# Patient Record
Sex: Female | Born: 1955 | Race: White | Hispanic: No | Marital: Married | Smoking: Former smoker
Health system: Southern US, Community
[De-identification: ages and names within clinical notes are randomized; demographics above are authoritative.]

## PROBLEM LIST (undated history)

## (undated) DIAGNOSIS — J3089 Other allergic rhinitis: Secondary | ICD-10-CM

## (undated) DIAGNOSIS — K219 Gastro-esophageal reflux disease without esophagitis: Secondary | ICD-10-CM

## (undated) DIAGNOSIS — J45909 Unspecified asthma, uncomplicated: Secondary | ICD-10-CM

## (undated) DIAGNOSIS — E039 Hypothyroidism, unspecified: Secondary | ICD-10-CM

## (undated) DIAGNOSIS — Z9889 Other specified postprocedural states: Secondary | ICD-10-CM

## (undated) DIAGNOSIS — I10 Essential (primary) hypertension: Secondary | ICD-10-CM

## (undated) DIAGNOSIS — R112 Nausea with vomiting, unspecified: Secondary | ICD-10-CM

## (undated) HISTORY — PX: OTHER SURGICAL HISTORY: SHX169

## (undated) HISTORY — PX: LYMPH NODE BIOPSY: SHX201

## (undated) HISTORY — PX: CHOLECYSTECTOMY: SHX55

## (undated) HISTORY — PX: KNEE ARTHROSCOPY: SUR90

## (undated) HISTORY — PX: ABDOMINAL HYSTERECTOMY: SHX81

---

## 2017-08-24 DIAGNOSIS — C9110 Chronic lymphocytic leukemia of B-cell type not having achieved remission: Secondary | ICD-10-CM

## 2017-08-24 HISTORY — DX: Chronic lymphocytic leukemia of B-cell type not having achieved remission: C91.10

## 2022-02-02 ENCOUNTER — Ambulatory Visit: Payer: Self-pay

## 2022-02-02 ENCOUNTER — Telehealth: Payer: Self-pay | Admitting: Orthopedic Surgery

## 2022-02-02 ENCOUNTER — Ambulatory Visit (INDEPENDENT_AMBULATORY_CARE_PROVIDER_SITE_OTHER): Payer: Managed Care, Other (non HMO) | Admitting: Orthopedic Surgery

## 2022-02-02 VITALS — BP 133/79 | HR 96 | Ht 67.0 in | Wt 200.0 lb

## 2022-02-02 DIAGNOSIS — R2232 Localized swelling, mass and lump, left upper limb: Secondary | ICD-10-CM | POA: Diagnosis not present

## 2022-02-02 DIAGNOSIS — M79642 Pain in left hand: Secondary | ICD-10-CM | POA: Diagnosis not present

## 2022-02-02 MED ORDER — TRAMADOL HCL 50 MG PO TABS: 50.0000 mg | ORAL_TABLET | Freq: Four times a day (QID) | ORAL | 0 refills | Status: AC | PRN

## 2022-02-02 NOTE — Telephone Encounter (Signed)
Patient called advised she is not having the MRI until 02/08/2022. Patient asked if Dr Tempie Donning can call in something that is not an NSAID for her?  Patient uses [facility]. The number to contact patient is [phone]

## 2022-02-02 NOTE — Progress Notes (Signed)
Office Visit Note   Patient: Autumn Hines           Date of Birth: [birth year]           MRN: [id number] Visit Date: 02/02/2022              Requested by: Sherilyn Cooter, MD [address] [place],  [place] [address] PCP: No primary care provider on file.   Assessment & Plan: Visit Diagnoses:  1. Pain of left hand   2. 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  Like to get an MRI to further evaluate this lesion.  We discussed conservative and surgical treatment options for both ganglion cyst and de Quervain's tenosynovitis.  She will see me back once the mass completed we will discuss next steps in treatment.  Follow-Up Instructions: No follow-ups on file.   Orders:  Orders Placed This Encounter  Procedures   XR Hand Complete Left   MR Wrist Left w/ contrast   No orders of the defined types were placed in this encounter.     Procedures: No procedures performed   Clinical Data: No additional findings.   Subjective: Chief Complaint  Patient presents with   Left Wrist - Pain    RIGHT Handed, Pain:5/10 with spikes to 8-9, +swelling,    This is a 66 year old right-hand-dominant female who presents with pain at the radial aspect of the left wrist.  This started approximately 3 weeks ago.  She started with having pain at the radial aspect of the wrist around the area of the radial styloid and first dorsal compartment.  She then developed a mass at the base of the thumb distal to the radial styloid.  She thinks is related to an IV attempt at the radial aspect of the left wrist done in March.  Her pain at the radial aspect this is typically 5/10 in nature became as bad as 8-9/10 with her activities.  She has worn a brace but this seems to  irritate the area and is uncomfortable for her.  She denies any injury to her wrist.  She denies any lumps or bumps elsewhere in the hand or upper extremity.  She is unable to take NSAIDs but has been taking Tylenol and using topical Voltaren gel for symptom relief.    Review of Systems   Objective: Vital Signs: BP 133/79 (BP Location: Left Arm, Patient Position: Sitting)   Pulse 96   Ht '5\' 7"'$  (1.702 m)   Wt 200 lb (90.7 kg)   BMI 31.32 kg/m   Physical Exam Constitutional:      Appearance: Normal appearance.  Cardiovascular:     Rate and Rhythm: Normal rate.     Pulses: Normal pulses.  Pulmonary:     Effort: Pulmonary effort is normal.  Skin:    General: Skin is warm and dry.     Capillary Refill: Capillary refill takes less than 2 seconds.  Neurological:     Mental Status: She is alert.     Left Hand Exam   Tenderness  Left hand tenderness location: TTP at base of thumb from just proximal to radial styloid to [facility] joint.   Other  Erythema: absent Sensation: normal Pulse: present  Comments:  Approx 1x1 cm firm, round,  mobile mass at radial wrist just distal to radial styloid.  Swelling over first dorsal compartment.  Positive Finkelstein test.       Specialty Comments:  No specialty comments available.  Imaging: No results found.   PMFS History: There are no problems to display for this patient.  No past medical history on file.  No family history on file.   Social History   Occupational History   Not on file  Tobacco Use   Smoking status: Not on file   Smokeless tobacco: Not on file  Substance and Sexual Activity   Alcohol use: Not on file   Drug use: Not on file   Sexual activity: Not on file

## 2022-02-02 NOTE — Addendum Note (Signed)
Addended by: Sherilyn Cooter on: 02/02/2022 04:25 PM   Modules accepted: Orders

## 2022-02-08 ENCOUNTER — Ambulatory Visit
Admission: RE | Admit: 2022-02-08 | Discharge: 2022-02-08 | Disposition: A | Discharge status: Home / Self Care | Payer: Managed Care, Other (non HMO) | Source: Ambulatory Visit | Attending: Orthopedic Surgery | Admitting: Orthopedic Surgery

## 2022-02-08 DIAGNOSIS — R2232 Localized swelling, mass and lump, left upper limb: Secondary | ICD-10-CM

## 2022-02-08 MED ORDER — GADOBENATE DIMEGLUMINE 529 MG/ML IV SOLN
19.0000 mL | Freq: Once | INTRAVENOUS | Status: AC | PRN
  Administered 2022-02-08: 19 mL via INTRAVENOUS

## 2022-02-12 ENCOUNTER — Ambulatory Visit (INDEPENDENT_AMBULATORY_CARE_PROVIDER_SITE_OTHER): Payer: Managed Care, Other (non HMO) | Admitting: Orthopedic Surgery

## 2022-02-12 DIAGNOSIS — M654 Radial styloid tenosynovitis [de Quervain]: Secondary | ICD-10-CM | POA: Insufficient documentation

## 2022-02-12 NOTE — H&P (View-Only) (Signed)
Office Visit Note   Patient: Autumn Hines           Date of Birth: [birth year]           MRN: [id number] Visit Date: 02/12/2022              Requested by: Burnett Sheng, MD [address] [place],  [place] [address] PCP: Burnett Sheng, MD   Assessment & Plan: Visit Diagnoses:  1. De Quervain's tenosynovitis, left     Plan: We reviewed patient's MRI which does not show any mass but is notable for severe left first dorsal compartment inflammation and tendinosis of the APL and EPB tendons.  We again reviewed the nature of de Quervain's tenosynovitis as well as his diagnosis, prognosis, both conservative and surgical treatment options.  Patient is not interested in injections.  She would like to proceed with first dorsal compartment release.  We reviewed the risks of surgery including but not limited to bleeding, infection, damage to neurovascular structures, incomplete symptom relief.  Patient would like to proceed.  A surgical date and time will be confirmed with the patient.  Follow-Up Instructions: No follow-ups on file.   Orders:  No orders of the defined types were placed in this encounter.  No orders of the defined types were placed in this encounter.     Procedures: No procedures performed   Clinical Data: No additional findings.   Subjective: Chief Complaint  Patient presents with   Left Wrist - Follow-up    MRI Review    This is a 66 year old female who presents with continued pain at the radial aspect of the left wrist.  She last seen in my office approximately 10 days ago at which time she had been having pain at the radial aspect of the wrist for the last 3 weeks.  The pain is 5/10 at rest but can be as bad as 8-9/10 at worst.  The pain is worse with ulnar deviation of the wrist.  She is been wearing a brace.  She is unable to tolerate oral NSAIDs and has been taking Tylenol and using topical Voltaren gel.  She underwent MRI for what seems to be a mass  distal to the first dorsal compartment.      Review of Systems   Objective: Vital Signs: There were no vitals taken for this visit.  Physical Exam Constitutional:      Appearance: Normal appearance.  Cardiovascular:     Rate and Rhythm: Normal rate.     Pulses: Normal pulses.  Pulmonary:     Effort: Pulmonary effort is normal.  Skin:    General: Skin is warm and dry.     Capillary Refill: Capillary refill takes less than 2 seconds.  Neurological:     Mental Status: She is alert.     Left Hand Exam   Tenderness  Left hand tenderness location: Severely TTP around radial styloid and first dorsal compartment.   Other  Erythema: absent Sensation: normal Pulse: present  Comments:  Significant pain w/ Wynn Maudlin test      Specialty Comments:  No specialty comments available.  Imaging: No results found.   PMFS History: Patient Active Problem List   Diagnosis Date Noted   De Quervain's tenosynovitis, left 02/12/2022   No past medical history on file.  No family history on file.   Social History   Occupational History   Not on file  Tobacco Use   Smoking status: Not on file   Smokeless  tobacco: Not on file  Substance and Sexual Activity   Alcohol use: Not on file   Drug use: Not on file   Sexual activity: Not on file

## 2022-02-18 ENCOUNTER — Encounter (HOSPITAL_BASED_OUTPATIENT_CLINIC_OR_DEPARTMENT_OTHER): Payer: Self-pay | Admitting: Orthopedic Surgery

## 2022-02-18 ENCOUNTER — Other Ambulatory Visit: Payer: Self-pay | Admitting: Orthopedic Surgery

## 2022-02-18 ENCOUNTER — Other Ambulatory Visit: Payer: Self-pay

## 2022-02-18 ENCOUNTER — Telehealth: Payer: Self-pay | Admitting: Orthopedic Surgery

## 2022-02-18 MED ORDER — TRAMADOL HCL 50 MG PO TABS: 50.0000 mg | ORAL_TABLET | Freq: Four times a day (QID) | ORAL | 0 refills | Status: AC | PRN

## 2022-02-18 NOTE — Telephone Encounter (Signed)
Patient's surgery has been scheduled for 03/04/22. Patient requests a refill on Tramadol to last her until surgery. Patient takes one per night so she can sleep. She uses [facility] on [address]. Please call to advise.

## 2022-03-02 ENCOUNTER — Encounter (HOSPITAL_BASED_OUTPATIENT_CLINIC_OR_DEPARTMENT_OTHER)
Admission: RE | Admit: 2022-03-02 | Discharge: 2022-03-02 | Disposition: A | Discharge status: Home / Self Care | Payer: Managed Care, Other (non HMO) | Source: Ambulatory Visit | Attending: Orthopedic Surgery | Admitting: Orthopedic Surgery

## 2022-03-02 DIAGNOSIS — Z01810 Encounter for preprocedural cardiovascular examination: Secondary | ICD-10-CM | POA: Diagnosis not present

## 2022-03-02 DIAGNOSIS — I10 Essential (primary) hypertension: Secondary | ICD-10-CM | POA: Insufficient documentation

## 2022-03-04 ENCOUNTER — Ambulatory Visit (HOSPITAL_BASED_OUTPATIENT_CLINIC_OR_DEPARTMENT_OTHER)
Admission: RE | Admit: 2022-03-04 | Discharge: 2022-03-04 | Disposition: A | Discharge status: Home / Self Care | Payer: Managed Care, Other (non HMO) | Attending: Orthopedic Surgery | Admitting: Orthopedic Surgery

## 2022-03-04 ENCOUNTER — Ambulatory Visit (HOSPITAL_BASED_OUTPATIENT_CLINIC_OR_DEPARTMENT_OTHER): Payer: Managed Care, Other (non HMO) | Admitting: Certified Registered"

## 2022-03-04 ENCOUNTER — Encounter (HOSPITAL_BASED_OUTPATIENT_CLINIC_OR_DEPARTMENT_OTHER): Payer: Self-pay | Admitting: Orthopedic Surgery

## 2022-03-04 ENCOUNTER — Encounter (HOSPITAL_BASED_OUTPATIENT_CLINIC_OR_DEPARTMENT_OTHER)
Admission: RE | Disposition: A | Discharge status: Home / Self Care | Payer: Self-pay | Source: Home / Self Care | Attending: Orthopedic Surgery

## 2022-03-04 ENCOUNTER — Other Ambulatory Visit: Payer: Self-pay

## 2022-03-04 DIAGNOSIS — I10 Essential (primary) hypertension: Secondary | ICD-10-CM

## 2022-03-04 DIAGNOSIS — M654 Radial styloid tenosynovitis [de Quervain]: Secondary | ICD-10-CM | POA: Insufficient documentation

## 2022-03-04 DIAGNOSIS — Z79899 Other long term (current) drug therapy: Secondary | ICD-10-CM | POA: Diagnosis not present

## 2022-03-04 DIAGNOSIS — Z87891 Personal history of nicotine dependence: Secondary | ICD-10-CM | POA: Insufficient documentation

## 2022-03-04 DIAGNOSIS — K219 Gastro-esophageal reflux disease without esophagitis: Secondary | ICD-10-CM | POA: Insufficient documentation

## 2022-03-04 DIAGNOSIS — E039 Hypothyroidism, unspecified: Secondary | ICD-10-CM

## 2022-03-04 HISTORY — DX: Gastro-esophageal reflux disease without esophagitis: K21.9

## 2022-03-04 HISTORY — PX: DORSAL COMPARTMENT RELEASE: SHX5039

## 2022-03-04 HISTORY — DX: Hypothyroidism, unspecified: E03.9

## 2022-03-04 HISTORY — DX: Essential (primary) hypertension: I10

## 2022-03-04 HISTORY — DX: Unspecified asthma, uncomplicated: J45.909

## 2022-03-04 HISTORY — DX: Other specified postprocedural states: Z98.890

## 2022-03-04 HISTORY — DX: Other specified postprocedural states: R11.2

## 2022-03-04 HISTORY — DX: Other allergic rhinitis: J30.89

## 2022-03-04 SURGERY — RELEASE, FIRST DORSAL COMPARTMENT, HAND
Anesthesia: Monitor Anesthesia Care | Site: Wrist | Laterality: Left

## 2022-03-04 MED ORDER — FENTANYL CITRATE (PF) 100 MCG/2ML IJ SOLN
INTRAMUSCULAR | Status: AC
  Filled 2022-03-04: qty 2

## 2022-03-04 MED ORDER — PROMETHAZINE HCL 25 MG/ML IJ SOLN: 6.2500 mg | INTRAMUSCULAR | Status: DC | PRN

## 2022-03-04 MED ORDER — FENTANYL CITRATE (PF) 100 MCG/2ML IJ SOLN
INTRAMUSCULAR | Status: DC | PRN
  Administered 2022-03-04: 50 ug via INTRAVENOUS

## 2022-03-04 MED ORDER — PROPOFOL 500 MG/50ML IV EMUL
INTRAVENOUS | Status: AC
  Filled 2022-03-04: qty 50

## 2022-03-04 MED ORDER — LIDOCAINE HCL (PF) 1 % IJ SOLN
INTRAMUSCULAR | Status: AC
  Filled 2022-03-04: qty 30

## 2022-03-04 MED ORDER — DEXMEDETOMIDINE HCL IN NACL 80 MCG/20ML IV SOLN
INTRAVENOUS | Status: AC
  Filled 2022-03-04: qty 20

## 2022-03-04 MED ORDER — OXYCODONE HCL 5 MG PO TABS: 5.0000 mg | ORAL_TABLET | Freq: Four times a day (QID) | ORAL | 0 refills | Status: AC | PRN

## 2022-03-04 MED ORDER — 0.9 % SODIUM CHLORIDE (POUR BTL) OPTIME
TOPICAL | Status: DC | PRN
  Administered 2022-03-04: 100 mL

## 2022-03-04 MED ORDER — KETOROLAC TROMETHAMINE 30 MG/ML IJ SOLN: 30.0000 mg | Freq: Once | INTRAMUSCULAR | Status: DC

## 2022-03-04 MED ORDER — LACTATED RINGERS IV SOLN: INTRAVENOUS | Status: DC

## 2022-03-04 MED ORDER — AMISULPRIDE (ANTIEMETIC) 5 MG/2ML IV SOLN
10.0000 mg | Freq: Once | INTRAVENOUS | Status: DC | PRN
Start: 2022-03-04 — End: 2022-03-04

## 2022-03-04 MED ORDER — LIDOCAINE HCL 1 % IJ SOLN
INTRAMUSCULAR | Status: DC | PRN
  Administered 2022-03-04: 8 mL

## 2022-03-04 MED ORDER — PROPOFOL 10 MG/ML IV BOLUS
INTRAVENOUS | Status: DC | PRN
  Administered 2022-03-04: 20 mg via INTRAVENOUS
  Administered 2022-03-04: 10 mg via INTRAVENOUS
  Administered 2022-03-04: 20 mg via INTRAVENOUS
  Administered 2022-03-04: 10 mg via INTRAVENOUS

## 2022-03-04 MED ORDER — OXYCODONE HCL 5 MG PO TABS: 5.0000 mg | ORAL_TABLET | Freq: Once | ORAL | Status: DC | PRN

## 2022-03-04 MED ORDER — OXYCODONE HCL 5 MG/5ML PO SOLN: 5.0000 mg | Freq: Once | ORAL | Status: DC | PRN

## 2022-03-04 MED ORDER — MIDAZOLAM HCL 5 MG/5ML IJ SOLN
INTRAMUSCULAR | Status: DC | PRN
  Administered 2022-03-04: 2 mg via INTRAVENOUS

## 2022-03-04 MED ORDER — ACETAMINOPHEN 10 MG/ML IV SOLN
INTRAVENOUS | Status: AC
  Filled 2022-03-04: qty 100

## 2022-03-04 MED ORDER — PHENYLEPHRINE HCL (PRESSORS) 10 MG/ML IV SOLN
INTRAVENOUS | Status: AC
  Filled 2022-03-04: qty 1

## 2022-03-04 MED ORDER — PROPOFOL 500 MG/50ML IV EMUL
INTRAVENOUS | Status: DC | PRN
  Administered 2022-03-04: 85 ug/kg/min via INTRAVENOUS

## 2022-03-04 MED ORDER — ACETAMINOPHEN 10 MG/ML IV SOLN
1000.0000 mg | Freq: Once | INTRAVENOUS | Status: DC | PRN
  Administered 2022-03-04: 1000 mg via INTRAVENOUS

## 2022-03-04 MED ORDER — FENTANYL CITRATE (PF) 100 MCG/2ML IJ SOLN
25.0000 ug | INTRAMUSCULAR | Status: DC | PRN
  Administered 2022-03-04: 50 ug via INTRAVENOUS
  Administered 2022-03-04: 25 ug via INTRAVENOUS

## 2022-03-04 MED ORDER — ONDANSETRON HCL 4 MG/2ML IJ SOLN
INTRAMUSCULAR | Status: DC | PRN
  Administered 2022-03-04: 4 mg via INTRAVENOUS

## 2022-03-04 MED ORDER — MIDAZOLAM HCL 2 MG/2ML IJ SOLN
INTRAMUSCULAR | Status: AC
  Filled 2022-03-04: qty 2

## 2022-03-04 SURGICAL SUPPLY — 35 items
APL PRP STRL LF DISP 70% ISPRP (MISCELLANEOUS) ×1
BLADE SURG 15 STRL LF DISP TIS (BLADE) ×1 IMPLANT
BLADE SURG 15 STRL SS (BLADE) ×2
BNDG CMPR 9X4 STRL LF SNTH (GAUZE/BANDAGES/DRESSINGS) ×1
BNDG ELASTIC 2X5.8 VLCR STR LF (GAUZE/BANDAGES/DRESSINGS) ×2 IMPLANT
BNDG ESMARK 4X9 LF (GAUZE/BANDAGES/DRESSINGS) ×2 IMPLANT
CHLORAPREP W/TINT 26 (MISCELLANEOUS) ×2 IMPLANT
CORD BIPOLAR FORCEPS 12FT (ELECTRODE) ×2 IMPLANT
COVER BACK TABLE 60X90IN (DRAPES) ×2 IMPLANT
COVER MAYO STAND STRL (DRAPES) ×2 IMPLANT
CUFF TOURN SGL QUICK 18X4 (TOURNIQUET CUFF) ×1 IMPLANT
CUFF TOURN SGL QUICK 24 (TOURNIQUET CUFF)
CUFF TRNQT CYL 24X4X16.5-23 (TOURNIQUET CUFF) IMPLANT
DRAPE EXTREMITY T 121X128X90 (DISPOSABLE) ×2 IMPLANT
DRAPE SURG 17X23 STRL (DRAPES) ×2 IMPLANT
GAUZE SPONGE 4X4 12PLY STRL (GAUZE/BANDAGES/DRESSINGS) ×1 IMPLANT
GAUZE XEROFORM 1X8 LF (GAUZE/BANDAGES/DRESSINGS) ×2 IMPLANT
GLOVE BIO SURGEON STRL SZ7 (GLOVE) ×2 IMPLANT
GLOVE BIOGEL PI IND STRL 7.0 (GLOVE) ×1 IMPLANT
GLOVE BIOGEL PI INDICATOR 7.0 (GLOVE) ×1
GOWN STRL REUS W/ TWL LRG LVL3 (GOWN DISPOSABLE) ×1 IMPLANT
GOWN STRL REUS W/ TWL XL LVL3 (GOWN DISPOSABLE) ×1 IMPLANT
GOWN STRL REUS W/TWL LRG LVL3 (GOWN DISPOSABLE) ×2
GOWN STRL REUS W/TWL XL LVL3 (GOWN DISPOSABLE) ×2
NDL HYPO 25X1 1.5 SAFETY (NEEDLE) ×1 IMPLANT
NEEDLE HYPO 25X1 1.5 SAFETY (NEEDLE) ×2 IMPLANT
NS IRRIG 1000ML POUR BTL (IV SOLUTION) ×2 IMPLANT
PACK BASIN DAY SURGERY FS (CUSTOM PROCEDURE TRAY) ×2 IMPLANT
SLEEVE SCD COMPRESS KNEE MED (STOCKING) IMPLANT
SUT ETHILON 4 0 PS 2 18 (SUTURE) ×2 IMPLANT
SUT VICRYL 4-0 PS2 18IN ABS (SUTURE) IMPLANT
SYR BULB EAR ULCER 3OZ GRN STR (SYRINGE) ×2 IMPLANT
SYR CONTROL 10ML LL (SYRINGE) ×2 IMPLANT
TOWEL GREEN STERILE FF (TOWEL DISPOSABLE) ×2 IMPLANT
UNDERPAD 30X36 HEAVY ABSORB (UNDERPADS AND DIAPERS) ×2 IMPLANT

## 2022-03-04 NOTE — Op Note (Signed)
   Date of Surgery: 03/04/2022  INDICATIONS: Patient is a 66 y.o.-year-old female with left de Quervain's tenosynovitis.  Risks, benefits, and alternatives to surgery were again discussed with the patient in the preoperative area. The patient wishes to proceed with surgery.  Informed consent was signed after our discussion.   PREOPERATIVE DIAGNOSIS:  Left De Quervain's tenosynovitis  POSTOPERATIVE DIAGNOSIS: Same.  PROCEDURE:  Left first dorsal compartment release   SURGEON: Audria Nine, M.D.  ASSIST:   ANESTHESIA:  Local, MAC  IV FLUIDS AND URINE: See anesthesia.  ESTIMATED BLOOD LOSS: <5 mL.  IMPLANTS: * No implants in log *   DRAINS: None  COMPLICATIONS: None  DESCRIPTION OF PROCEDURE: The patient was met in the preoperative holding area where the surgical site was marked and the consent form was verified.  The patient was then taken to the operating room and transferred to the operating table.  All bony prominences were well padded.  A tourniquet was applied to the left forearm.  Monitored sesation was induced.    A formal time-out was performed to confirm that this was the correct patient, surgery, side, and site. A local block was performed using 10cc of 1% plain lidocaine  The operative extremity was prepped and draped in the usual and sterile fashion.  Second timeout, the limb was gently exsanguinated with an Esmarch bandage and the tourniquet inflated 250 mmHg.  A transverse incision was made just proximal to the radial styloid.  The skin only was incised.  Blunt dissection was used to identify branches of the superficial radial nerve which were protected throughout the surgery.  The first dorsal compartment was identified.  An incision was made at the dorsal aspect of the compartment to prevent volar subluxation of the tendons postoperatively.  A tenotomy scissor was used to open the first dorsal compartment.  There was significant thickening of the compartment with  obvious tendinosis and fraying of the APL and EPB tendons.  There was some mild tenosynovial tissue which was sharply debrided.  There was no separate subsheath for the EPB tendon.  Following complete release, the wound was thoroughly irrigated with copious sterile saline.  The tourniquet was deflated and hemostasis achieved with bipolar cautery with care taken to avoid injury to the superficial nerves.  The fingers were all warm, pink, and well-perfused with the tourniquet deflated.  The wound was then closed using a 4-0 nylon suture in horizontal mattress fashion.  The wound was dressed with Xeroform, 4 x 4's, and an Ace wrap.  The patient was then reversed from anesthesia and transferred to the postoperative bed.  All counts were correct at the end of the procedure.  The patient was then taken the PACU in stable condition.   POSTOPERATIVE PLAN: She will be discharged home with appropriate pain medication and discharge instructions.  I will see her back in 10 to 14 days for her first postop visit.  Audria Nine, MD 11:41 AM

## 2022-03-04 NOTE — Interval H&P Note (Signed)
History and Physical Interval Note:  03/04/2022 10:21 AM  Autumn Hines  has presented today for surgery, with the diagnosis of LEFT DEQUERVAINS TENOSYNOVITIS.  The various methods of treatment have been discussed with the patient and family. After consideration of risks, benefits and other options for treatment, the patient has consented to  Procedure(s): LEFT FIRST DORSAL COMPARTMENT RELEASE (DEQUERVAIN) (Left) as a surgical intervention.  The patient's history has been reviewed, patient examined, no change in status, stable for surgery.  I have reviewed the patient's chart and labs.  Questions were answered to the patient's satisfaction.     Ziomara Birenbaum Demetrius Barrell

## 2022-03-04 NOTE — Anesthesia Postprocedure Evaluation (Signed)
Anesthesia Post Note  Patient: Autumn Hines  Procedure(s) Performed: LEFT FIRST DORSAL COMPARTMENT RELEASE (DEQUERVAIN) (Left: Wrist)     Patient location during evaluation: PACU Anesthesia Type: MAC Level of consciousness: awake Pain management: pain level controlled Vital Signs Assessment: post-procedure vital signs reviewed and stable Respiratory status: spontaneous breathing, nonlabored ventilation, respiratory function stable and patient connected to nasal cannula oxygen Cardiovascular status: blood pressure returned to baseline and stable Postop Assessment: no apparent nausea or vomiting Anesthetic complications: no   No notable events documented.  Last Vitals:  Vitals:   03/04/22 1215 03/04/22 1229  BP: 134/80 (!) 150/84  Pulse: 65 70  Resp: 12 16  Temp:  36.7 C  SpO2: 97% 98%    Last Pain:  Vitals:   03/04/22 1229  TempSrc: Tympanic  PainSc: 4                  Davaughn Hillyard P Rogelio Waynick

## 2022-03-04 NOTE — Discharge Instructions (Addendum)
 Charles Benfield, M.D. Hand Surgery  POST-OPERATIVE DISCHARGE INSTRUCTIONS   PRESCRIPTIONS: - You have been given a prescription to be taken as directed for post-operative pain control.  You may also take over the counter ibuprofen/aleve and tylenol for pain. Take this as directed on the packaging. Do not exceed 3000 mg tylenol/acetaminophen in 24 hours.  Ibuprofen 600-800 mg (3-4) tablets by mouth every 6 hours as needed for pain.   OR  Aleve 2 tablets by mouth every 12 hours (twice daily) as needed for pain.   AND/OR  Tylenol 1000 mg (2 tablets) every 8 hours as needed for pain.  - Please use your pain medication carefully, as refills are limited and you may not be provided with one.  As stated above, please use over the counter pain medicine - it will also be helpful with decreasing your swelling.    ANESTHESIA: -After your surgery, post-surgical discomfort or pain is likely. This discomfort can last several days to a few weeks. At certain times of the day your discomfort may be more intense.   Did you receive a nerve block?   - A nerve block can provide pain relief for one hour to two days after your surgery. As long as the nerve block is working, you will experience little or no sensation in the area the surgeon operated on.  - As the nerve block wears off, you will begin to experience pain or discomfort. It is very important that you begin taking your prescribed pain medication before the nerve block fully wears off. Treating your pain at the first sign of the block wearing off will ensure your pain is better controlled and more tolerable when full-sensation returns. Do not wait until the pain is intolerable, as the medicine will be less effective. It is better to treat pain in advance than to try and catch up.   General Anesthesia:  If you did not receive a nerve block during your surgery, you will need to start taking your pain medication shortly after your surgery and  should continue to do so as prescribed by your surgeon.     ICE AND ELEVATION: - You may use ice for the first 48-72 hours, but it is not critical.   - Motion of your fingers is very important to decrease the swelling.  - Elevation, as much as possible for the next 48 hours, is critical for decreasing swelling as well as for pain relief. Elevation means when you are seated or lying down, you hand should be at or above your heart. When walking, the hand needs to be at or above the level of your elbow.  - If the bandage gets too tight, it may need to be loosened. Please contact our office and we will instruct you in how to do this.    SURGICAL BANDAGES:  - Keep your dressing and/or splint clean and dry at all times.  You can remove your dressing 4 days from now and change with a dry dressing or Band-Aids as needed thereafter. - You may place a plastic bag over your bandage to shower, but be careful, do not get your bandages wet.  - After the bandages have been removed, it is OK to get the stitches wet in a shower or with hand washing. Do Not soak or submerge the wound yet. Please do not use lotions or creams on the stitches.      HAND THERAPY:  - You may not need any. If you do,   we will begin this at your follow up visit in the clinic.    ACTIVITY AND WORK: - You are encouraged to move any fingers which are not in the bandage.  - Light use of the fingers is allowed to assist the other hand with daily hygiene and eating, but strong gripping or lifting is often uncomfortable and should be avoided.  - You might miss a variable period of time from work and hopefully this issue has been discussed prior to surgery. You may not do any heavy work with your affected hand for about 2 weeks.    [facility] [address] [place],  [place]  [id number] [phone]    May take Tylenol after 6pm, if needed.   Post Anesthesia Home Care Instructions  Activity: Get plenty of  rest for the remainder of the day. A responsible individual must stay with you for 24 hours following the procedure.  For the next 24 hours, DO NOT: -Drive a car -Paediatric nurse -Drink alcoholic beverages -Take any medication unless instructed by your physician -Make any legal decisions or sign important papers.  Meals: Start with liquid foods such as gelatin or soup. Progress to regular foods as tolerated. Avoid greasy, spicy, heavy foods. If nausea and/or vomiting occur, drink only clear liquids until the nausea and/or vomiting subsides. Call your physician if vomiting continues.  Special Instructions/Symptoms: Your throat may feel dry or sore from the anesthesia or the breathing tube placed in your throat during surgery. If this causes discomfort, gargle with warm salt water. The discomfort should disappear within 24 hours.  If you had a scopolamine patch placed behind your ear for the management of post- operative nausea and/or vomiting:  1. The medication in the patch is effective for 72 hours, after which it should be removed.  Wrap patch in a tissue and discard in the trash. Wash hands thoroughly with soap and water. 2. You may remove the patch earlier than 72 hours if you experience unpleasant side effects which may include dry mouth, dizziness or visual disturbances. 3. Avoid touching the patch. Wash your hands with soap and water after contact with the patch.

## 2022-03-04 NOTE — Transfer of Care (Signed)
Immediate Anesthesia Transfer of Care Note  Patient: Autumn Hines  Procedure(s) Performed: LEFT FIRST DORSAL COMPARTMENT RELEASE (DEQUERVAIN) (Left: Wrist)  Patient Location: PACU  Anesthesia Type:MAC  Level of Consciousness: awake, alert , oriented and patient cooperative  Airway & Oxygen Therapy: Patient Spontanous Breathing and Patient connected to face mask oxygen  Post-op Assessment: Report given to RN and Post -op Vital signs reviewed and stable  Post vital signs: Reviewed and stable  Last Vitals:  Vitals Value Taken Time  BP    Temp    Pulse 84 03/04/22 1142  Resp    SpO2 99 % 03/04/22 1142  Vitals shown include unvalidated device data.  Last Pain:  Vitals:   03/04/22 0922  TempSrc: Oral  PainSc: 0-No pain         Complications: No notable events documented.

## 2022-03-04 NOTE — Anesthesia Preprocedure Evaluation (Addendum)
Anesthesia Evaluation  Patient identified by MRN, date of birth, ID band Patient awake    Reviewed: Allergy & Precautions, NPO status , Patient's Chart, lab work & pertinent test results  History of Anesthesia Complications (+) PONV and history of anesthetic complications  Airway Mallampati: II  TM Distance: >3 FB Neck ROM: Full    Dental no notable dental hx.    Pulmonary asthma , former smoker,    Pulmonary exam normal        Cardiovascular hypertension, Pt. on medications Normal cardiovascular exam     Neuro/Psych negative neurological ROS  negative psych ROS   GI/Hepatic Neg liver ROS, GERD  Medicated and Controlled,  Endo/Other  Hypothyroidism   Renal/GU negative Renal ROS     Musculoskeletal negative musculoskeletal ROS (+)   Abdominal (+) + obese,   Peds  Hematology negative hematology ROS (+)   Anesthesia Other Findings LEFT DEQUERVAINS TENOSYNOVITIS  Reproductive/Obstetrics                            Anesthesia Physical Anesthesia Plan  ASA: 3  Anesthesia Plan: MAC   Post-op Pain Management:    Induction: Intravenous  PONV Risk Score and Plan: 3 and Ondansetron, Dexamethasone, Propofol infusion, Midazolam and Treatment may vary due to age or medical condition  Airway Management Planned: Simple Face Mask  Additional Equipment:   Intra-op Plan:   Post-operative Plan:   Informed Consent: I have reviewed the patients History and Physical, chart, labs and discussed the procedure including the risks, benefits and alternatives for the proposed anesthesia with the patient or authorized representative who has indicated his/her understanding and acceptance.     Dental advisory given  Plan Discussed with: CRNA  Anesthesia Plan Comments:         Anesthesia Quick Evaluation

## 2022-03-04 NOTE — Anesthesia Procedure Notes (Signed)
Procedure Name: MAC Date/Time: 03/04/2022 11:07 AM  Performed by: Signe Colt, CRNAPre-anesthesia Checklist: Patient identified, Emergency Drugs available, Suction available and Patient being monitored Patient Re-evaluated:Patient Re-evaluated prior to induction Oxygen Delivery Method: Simple face mask

## 2022-03-05 ENCOUNTER — Encounter (HOSPITAL_BASED_OUTPATIENT_CLINIC_OR_DEPARTMENT_OTHER): Payer: Self-pay | Admitting: Orthopedic Surgery

## 2022-03-12 ENCOUNTER — Telehealth: Payer: Self-pay

## 2022-03-12 NOTE — Telephone Encounter (Signed)
I called and got her in with [facility] to look at it and evaluate it and if she agrees that the stitches can come out then she will take them out

## 2022-03-12 NOTE — Telephone Encounter (Signed)
Patient called stating that she thinks that her stitches need to come out before her appointment on Monday, 03/16/22.  Would like a call back to discuss?  CB# [phone].  Please advise.  Thank you.

## 2022-03-13 ENCOUNTER — Encounter: Payer: Managed Care, Other (non HMO) | Admitting: Orthopedic Surgery

## 2022-03-13 ENCOUNTER — Encounter: Payer: Self-pay | Admitting: Physician Assistant

## 2022-03-13 ENCOUNTER — Ambulatory Visit (INDEPENDENT_AMBULATORY_CARE_PROVIDER_SITE_OTHER): Payer: Managed Care, Other (non HMO) | Admitting: Physician Assistant

## 2022-03-13 DIAGNOSIS — M654 Radial styloid tenosynovitis [de Quervain]: Secondary | ICD-10-CM

## 2022-03-13 NOTE — Progress Notes (Signed)
Office Visit Note   Patient: Autumn Hines           Date of Birth: 16-Sep-1955           MRN: [id number] Visit Date: 03/13/2022              Requested by: Burnett Sheng, MD [address] [place],  [place] [address] PCP: Burnett Sheng, MD  Chief Complaint  Patient presents with   Left Wrist - Routine Post Op      HPI: Patient is a pleasant 66 year old woman who is 9 days status post release of left de Quervain's.  She is doing well.  She uses her wrist quite a bit and the stitches are tugging and she has noticed that the wound is healed.  She is not diabetic and wondering if she can get the sutures removed today  Assessment & Plan: Visit Diagnoses:  1. De Quervain's tenosynovitis, left     Plan: Sutures were removed without difficulty Steri-Strips placed she will follow-up with Dr. Tempie Donning  Follow-Up Instructions: No follow-ups on file.   Ortho Exam  Patient is alert, oriented, no adenopathy, well-dressed, normal affect, normal respiratory effort. Examination well apposed wound edges no drainage sutures in place there is some redness around the sutures from irritation.  The wound is healed.  No cellulitis no signs of infection no drainage  Imaging: No results found. No images are attached to the encounter.  Labs: No results found for: "HGBA1C", "ESRSEDRATE", "CRP", "LABURIC", "REPTSTATUS", "GRAMSTAIN", "CULT", "LABORGA"   No results found for: "ALBUMIN", "PREALBUMIN", "CBC"  No results found for: "MG" No results found for: "VD25OH"  No results found for: "PREALBUMIN"     No data to display           There is no height or weight on file to calculate BMI.  Orders:  No orders of the defined types were placed in this encounter.  No orders of the defined types were placed in this encounter.    Procedures: No procedures performed  Clinical Data: No additional findings.  ROS:  All other systems negative, except as noted in the  HPI. Review of Systems  Objective: Vital Signs: There were no vitals taken for this visit.  Specialty Comments:  No specialty comments available.  PMFS History: Patient Active Problem List   Diagnosis Date Noted   De Quervain's tenosynovitis, left 02/12/2022   Past Medical History:  Diagnosis Date   Asthma    allergy induced   CLL (chronic lymphocytic leukemia) ([facility]) 2019   started chemo 2022   Environmental and seasonal allergies    GERD (gastroesophageal reflux disease)    Hypertension    Hypothyroidism    PONV (postoperative nausea and vomiting)     History reviewed. No pertinent family history.  Past Surgical History:  Procedure Laterality Date   ABDOMINAL HYSTERECTOMY     CHOLECYSTECTOMY     DORSAL COMPARTMENT RELEASE Left 03/04/2022   Procedure: LEFT FIRST DORSAL COMPARTMENT RELEASE (DEQUERVAIN);  Surgeon: Sherilyn Cooter, MD;  Location: [facility];  Service: Orthopedics;  Laterality: Left;   KNEE ARTHROSCOPY Left    x2   LYMPH NODE BIOPSY Left    removal of parathyroid adenoma     Social History   Occupational History   Not on file  Tobacco Use   Smoking status: Former    Types: Cigarettes    Quit date: 2005    Years since quitting: 18.5   Smokeless tobacco: Never  Substance and Sexual Activity   Alcohol use: Not Currently   Drug use: Never   Sexual activity: Not on file    Comment: hyst

## 2022-03-16 ENCOUNTER — Encounter: Payer: Self-pay | Admitting: Orthopedic Surgery

## 2022-03-16 ENCOUNTER — Ambulatory Visit (INDEPENDENT_AMBULATORY_CARE_PROVIDER_SITE_OTHER): Payer: Managed Care, Other (non HMO) | Admitting: Orthopedic Surgery

## 2022-03-16 DIAGNOSIS — M654 Radial styloid tenosynovitis [de Quervain]: Secondary | ICD-10-CM

## 2022-03-16 NOTE — Progress Notes (Signed)
   Post-Op Visit Note   Patient: Autumn Hines           Date of Birth: 18-Nov-1955           MRN: [id number] Visit Date: 03/16/2022 PCP: Burnett Sheng, MD   Assessment & Plan:  Chief Complaint:  Chief Complaint  Patient presents with   Left Wrist - Routine Post Op    DeQuervains Tenosynovitis on 03/04/22   Visit Diagnoses:  1. De Quervain's tenosynovitis, left     Plan: Patient is now approximately 12 days out status post left first dorsal compartment release.  De Quervain's tenosynovitis.  She has no pain today.  Her previous symptoms have completely resolved.  The incision is clean, dry, and well-healed.  The sutures were removed last week as they were bothering her.  She has no numbness around the incision.  She is very happy with her result today.  Follow-Up Instructions: No follow-ups on file.   Orders:  No orders of the defined types were placed in this encounter.  No orders of the defined types were placed in this encounter.   Imaging: No results found.  PMFS History: Patient Active Problem List   Diagnosis Date Noted   De Quervain's tenosynovitis, left 02/12/2022   Past Medical History:  Diagnosis Date   Asthma    allergy induced   CLL (chronic lymphocytic leukemia) ([facility]) 2019   started chemo 2022   Environmental and seasonal allergies    GERD (gastroesophageal reflux disease)    Hypertension    Hypothyroidism    PONV (postoperative nausea and vomiting)     No family history on file.  Past Surgical History:  Procedure Laterality Date   ABDOMINAL HYSTERECTOMY     CHOLECYSTECTOMY     DORSAL COMPARTMENT RELEASE Left 03/04/2022   Procedure: LEFT FIRST DORSAL COMPARTMENT RELEASE (DEQUERVAIN);  Surgeon: Sherilyn Cooter, MD;  Location: [facility];  Service: Orthopedics;  Laterality: Left;   KNEE ARTHROSCOPY Left    x2   LYMPH NODE BIOPSY Left    removal of parathyroid adenoma     Social History   Occupational History   Not on  file  Tobacco Use   Smoking status: Former    Types: Cigarettes    Quit date: 2005    Years since quitting: 18.5   Smokeless tobacco: Never  Substance and Sexual Activity   Alcohol use: Not Currently   Drug use: Never   Sexual activity: Not on file    Comment: hyst

## 2023-03-22 IMAGING — MR MR WRIST*L* WO/W CM
10 series · 40 of 40 positions shown · IV contrast (multihance)
Comparison: Plain films left hand 02/02/2022.

CLINICAL DATA: Mass on the lateral aspect of the left wrist for 1
month.

EXAM:
MR OF THE LEFT WRIST WITHOUT AND WITH CONTRAST
TECHNIQUE: Multiplanar multisequence MR imaging of the left wrist was performed
both before and after the administration of intravenous contrast.
CONTRAST:  19 mL MULTIHANCE IV SOLN

[Series 4: T2 fat-sat · axial · left · 3.0mm · 0.31mm/px · z∈[-30,+43]mm · 5 of 25 slices shown (1 of 3)]
[im 1/25]
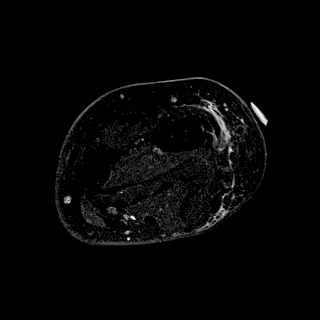
[im 7/25]
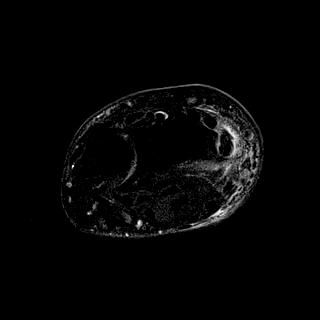
[im 13/25]
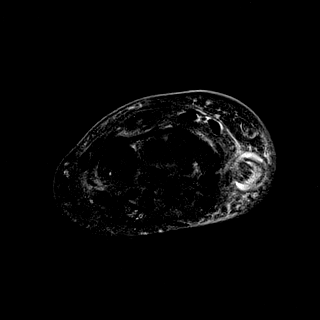
[im 19/25]
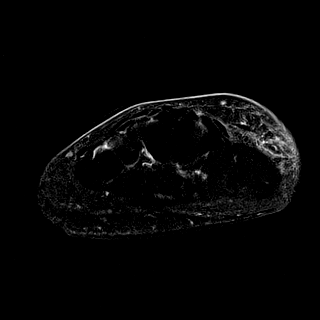
[im 25/25]
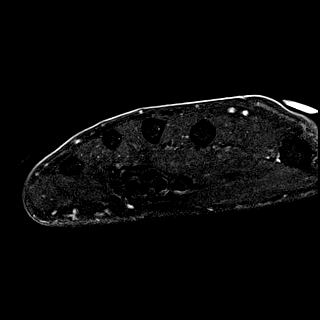

[Series 5: T1 · axial · left · 3.0mm · 0.39mm/px · z∈[-30,+43]mm · 5 of 25 slices shown (1 of 2)]
[im 1/25]
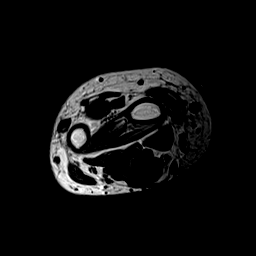
[im 7/25]
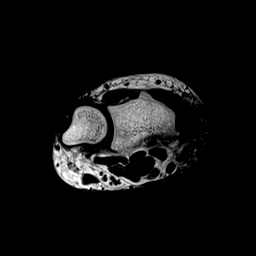
[im 13/25]
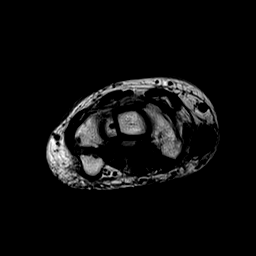
[im 19/25]
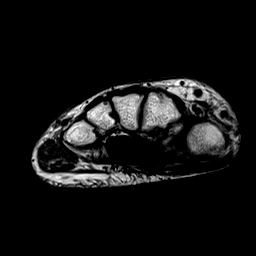
[im 25/25]
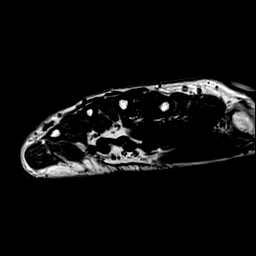

[Series 6: T1 · coronal · left · 3.0mm · 0.23mm/px · 3 of 15 slices shown (2 of 2)]
[im 1/15]
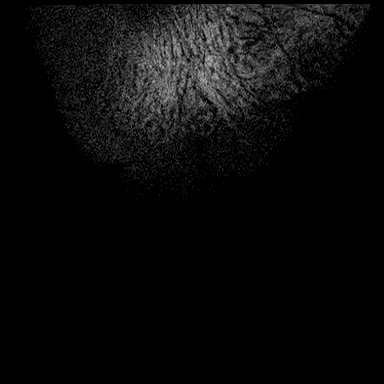
[im 8/15]
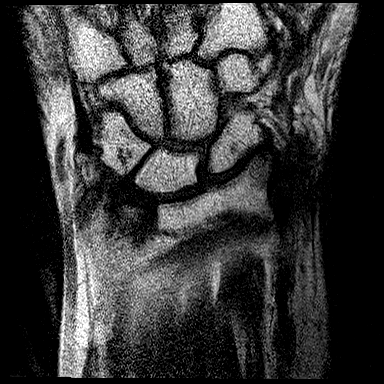
[im 15/15]
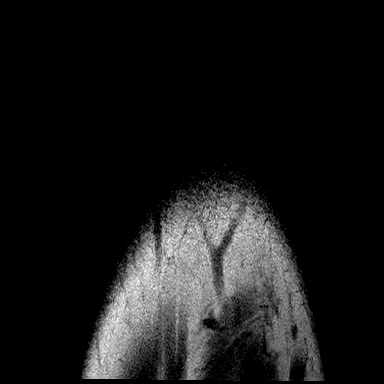

[Series 7: T2 fat-sat · coronal · left · 3.0mm · 0.31mm/px · 3 of 15 slices shown (2 of 3)]
[im 1/15]
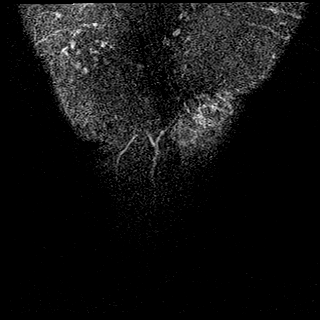
[im 8/15]
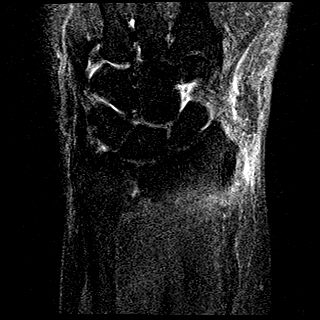
[im 15/15]
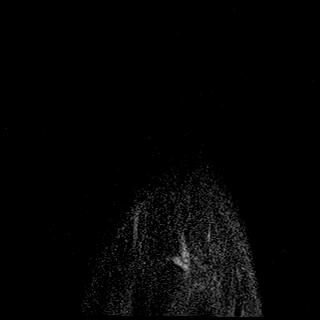

[Series 9: T2 fat-sat · coronal · left · 3.0mm · 0.39mm/px · 3 of 15 slices shown (3 of 3)]
[im 1/15]
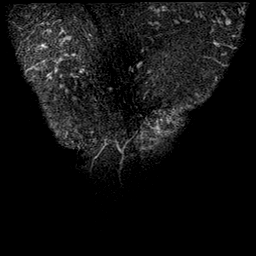
[im 8/15]
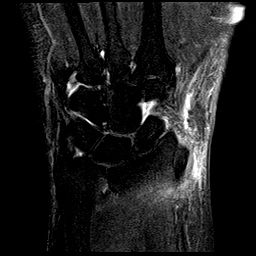
[im 15/15]
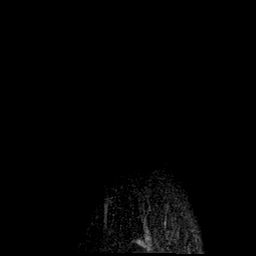

[Series 10: PD fat-sat · coronal · left · 3.0mm · 0.39mm/px · 3 of 15 slices shown (1 of 2)]
[im 1/15]
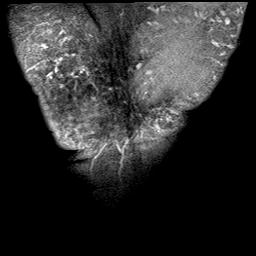
[im 8/15]
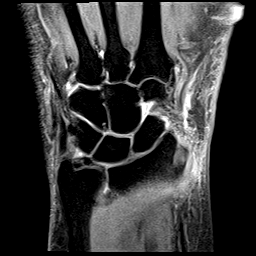
[im 15/15]
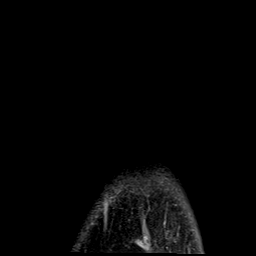

[Series 11: PD fat-sat · sagittal · left · 3.0mm · 0.43mm/px · 5 of 26 slices shown (2 of 2)]
[im 1/26]
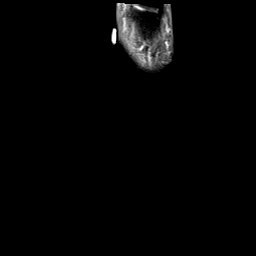
[im 7/26]
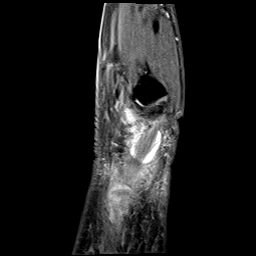
[im 13/26]
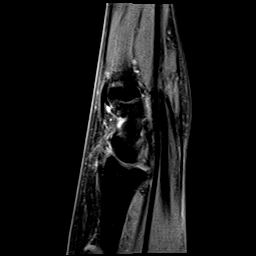
[im 19/26]
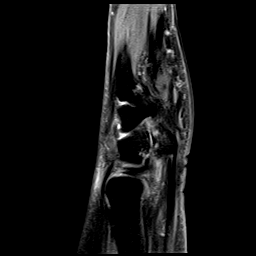
[im 26/26]
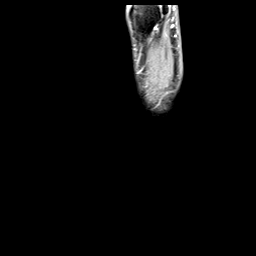

[Series 12: T1 fat-sat · axial · non-contrast · left · 3.0mm · 0.39mm/px · z∈[-33,+52]mm · 5 of 29 slices shown]
[im 1/29]
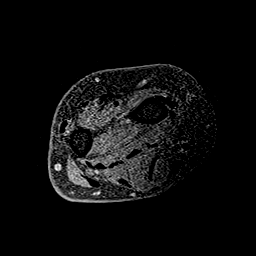
[im 8/29]
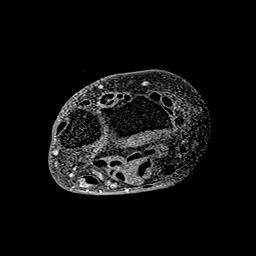
[im 15/29]
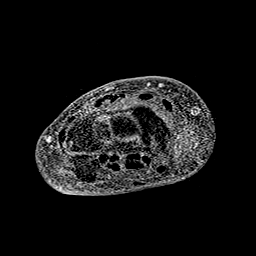
[im 22/29]
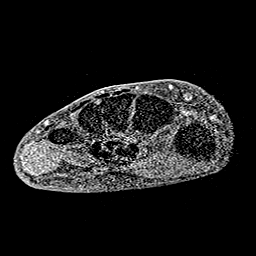
[im 29/29]
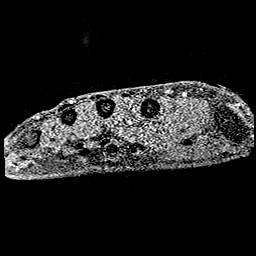

[Series 13: T1 fat-sat post-contrast · axial · left · 3.0mm · 0.39mm/px · z∈[-33,+52]mm · 5 of 29 slices shown (1 of 2)]
[im 1/29]
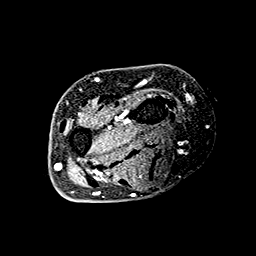
[im 8/29]
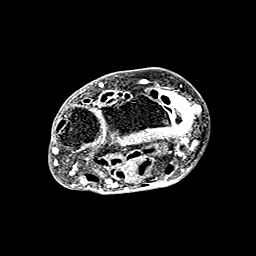
[im 15/29]
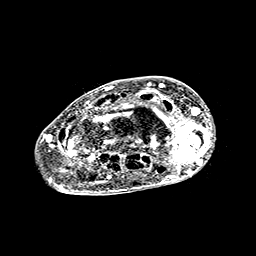
[im 22/29]
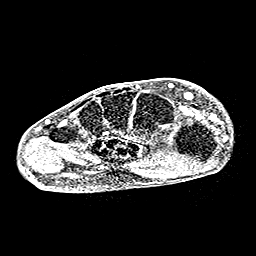
[im 29/29]
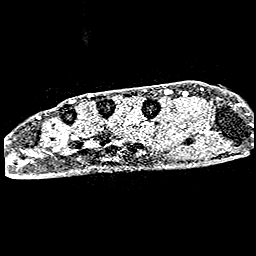

[Series 14: T1 fat-sat post-contrast · coronal · left · 3.0mm · 0.39mm/px · 3 of 17 slices shown (2 of 2)]
[im 1/17]
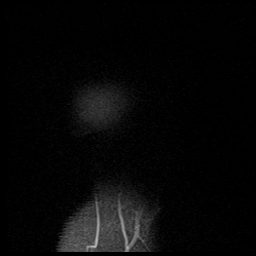
[im 9/17]
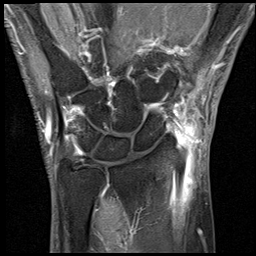
[im 17/17]
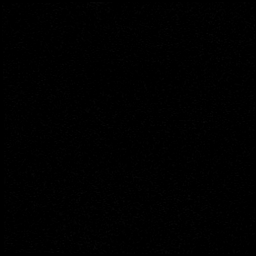

[40 of 40 positions shown; findings below may reference images not displayed]

FINDINGS: Ligaments: Appear intact.

Triangular fibrocartilage: Intact.

Tendons: There is fluid and enhancement about the compartment 1
extensor tendons and the tendons within compartment 1 are thickened.
Visualization is somewhat poor but the patient appears to have
extensive interstitial tearing of the abductor pollicis longus
tendon. Tendons about the wrist are otherwise normal in appearance.

Carpal tunnel/median nerve: Negative.

Guyon's canal: Negative.

Joint/cartilage: Mild degenerative change is present about the
wrist.

Bones/carpal alignment: No fracture, stress change or worrisome
lesion is identified. Carpal alignment is maintained.

Other: There is some soft tissue edema about the compartment 1
extensor tendons. No mass or focal fluid collection.
IMPRESSION: Severe appearing de Quervain tenosynovitis accounts for the
patient's palpated abnormality. Although visualization is somewhat
limited, the abductor pollicis longus tendon appears partially torn.

## 2023-09-18 ENCOUNTER — Ambulatory Visit (HOSPITAL_BASED_OUTPATIENT_CLINIC_OR_DEPARTMENT_OTHER): Payer: Managed Care, Other (non HMO) | Admitting: Physical Therapy

## 2024-09-04 ENCOUNTER — Encounter (HOSPITAL_BASED_OUTPATIENT_CLINIC_OR_DEPARTMENT_OTHER): Payer: Self-pay | Admitting: Physical Therapy

## 2024-09-10 NOTE — Therapy (Signed)
 " OUTPATIENT PHYSICAL THERAPY THORACOLUMBAR EVALUATION   Patient Name: Autumn Hines MRN: [id number] DOB:28-Apr-1956, 69 y.o., female Today's Date: 09/12/2024  END OF SESSION:  PT End of Session - 09/11/24 1422     Visit Number 1    Number of Visits 12    Date for Recertification  10/23/24    PT Start Time 1326    PT Stop Time 1414    PT Time Calculation (min) 48 min    Activity Tolerance Patient tolerated treatment well    Behavior During Therapy [facility] for tasks assessed/performed          Past Medical History:  Diagnosis Date   Asthma    allergy induced   CLL (chronic lymphocytic leukemia) ([facility]) 2019   started chemo 2022   Environmental and seasonal allergies    GERD (gastroesophageal reflux disease)    Hypertension    Hypothyroidism    PONV (postoperative nausea and vomiting)    Past Surgical History:  Procedure Laterality Date   ABDOMINAL HYSTERECTOMY     CHOLECYSTECTOMY     DORSAL COMPARTMENT RELEASE Left 03/04/2022   Procedure: LEFT FIRST DORSAL COMPARTMENT RELEASE (DEQUERVAIN);  Surgeon: Romona Harari, MD;  Location: [facility];  Service: Orthopedics;  Laterality: Left;   KNEE ARTHROSCOPY Left    x2   LYMPH NODE BIOPSY Left    removal of parathyroid adenoma     Patient Active Problem List   Diagnosis Date Noted   De Quervain's tenosynovitis, left 02/12/2022    PCP: Darell Barefoot, MD  REFERRING PROVIDER: Voncile Carne, DO  REFERRING DIAG:  [phone] (ICD-10-CM) - Radiculopathy, lumbar region M41.25 (ICD-10-CM) - Other idiopathic scoliosis, thoracolumbar region  Rationale for Evaluation and Treatment: Rehabilitation  THERAPY DIAG:  Other low back pain  Muscle weakness (generalized)  Difficulty in walking, not elsewhere classified  Abnormal posture  ONSET DATE: chronic back pain / PT order 06/27/24  SUBJECTIVE:                                                                                                                                                                                            SUBJECTIVE STATEMENT: Pt states she has had back pain since she was 12-15 years old.  Pt wore a [place] back brace for scoliosis when she was younger.  Pt states she hasn't really had much trouble with her back until the past 3-4 years.  Pt saw MD on 06/27/24 and had x rays.  Pt is scheduled for MRI this Friday and returns to see him on 09/27/24.  MD note indicated thoracolumbar pain related to his significant scoliotic deformity in  addition to symptoms consistent with probable lumbar stenosis with associated claudication type symptoms in her left lower extremity.  Discussed formal physical therapy for strengthening/stabilization.  Pt had fatigue with CLL and during the process of receiving chemo.  When pt finished chemo, she had more energy and increased her activity level.  Pt reports that's when she noticed having increased pain.  Pt has increased pain with standing and walking.  Pt c/o's of L hip pain when she walks for > 1 min.  Pt feels better when pushing a grocery cart or using hiking poles.  Pt performs a walking program 1.5-2 miles 2-3x/wk.   It's becoming more of struggle to walk and balance.  Pt states she feels wobbly and has weakness in L LE.  Pt reports 4.5/10 pain when changing her sheets.  Pt has increased pain with standing to cook a meal.  She has to sit down to rest her back.  Pt has increased pain with household chores and has to take rest breaks.         PERTINENT HISTORY:  Osteoporosis--Pt thinks osteopenia Chronic lymphocytic leukemia (CLL)  2019.  Finished chemo in 2023.  Pt states she's in total remission.  L TKR 01/25 HTN Surgery for Dequervain's  2023   Carpal Tunnel Release    PAIN:  NPRS:  2/10 current, 5-6/10 worst, 1.5/10 best Worst pain is with standing unsupported Location:  L sided lumbar and L post hip/glute Type:  sharp ache.  tingling in L LE.  Pt denies shooting pain down  LE  PRECAUTIONS: Other: L TKR, osteoporosis/osteopenia   WEIGHT BEARING RESTRICTIONS: No  FALLS:  Has patient fallen in last 6 months? No   OCCUPATION: Pt is retired  PLOF: Independent  PATIENT GOALS: improve balance, stamina, and L LE strength  NEXT MD VISIT: 09/27/2024  OBJECTIVE:  Note: Objective measures were completed at Evaluation unless otherwise noted.  DIAGNOSTIC FINDINGS:  Scoliosis films taken in office today demonstrating a significant lumbar levoscoliosis/thoracic dextroscoliosis. Multilevel spondylosis per MD note  PATIENT SURVEYS:  Modified Oswestry Disability Index:  22%  COGNITION: Overall cognitive status: Within functional limits for tasks assessed      MUSCLE LENGTH: Hamstrings: tightness in bilat  POSTURE: pt has a scoliotic curve.  Thoracic curve to R.  L hip higher.   PALPATION: Pt had no tenderness to palpate bilat lumbar paraspinals and glute  LUMBAR ROM:   AROM eval  Flexion WFL  Extension   Right lateral flexion WFL  Left lateral flexion 75%  Right rotation WFL  Left rotation 75%   (Blank rows = not tested)   LOWER EXTREMITY MMT:    MMT Right eval Left eval  Hip flexion 4/5 4/5  Hip extension    Hip abduction 26.0 29.1  Hip adduction    Hip internal rotation    Hip external rotation 4-/5 4+/5  Knee flexion 5/5 seated 5/5 seated  Knee extension 5/5 5/5  Ankle dorsiflexion 5/5 5/5  Ankle plantarflexion WFL seated WFL seated  Ankle inversion    Ankle eversion     (Blank rows = not tested)  LUMBAR SPECIAL TESTS:  Slump test:  negative bialt SLR test:  negative bilat    GAIT: Assistive device utilized: None Level of assistance: Complete Independence Comments: antalgic gait, increased stance time on L, harder foot strike on R.  Thoracic curve to Right  TREATMENT:  PATIENT EDUCATION:   Education details: dx, objective findings, rationale of interventions, POC, relevant anatomy, posture, and what to expect next treatment.  PT answered Pt's questions.  Person educated: Patient Education method: Medical Illustrator Education comprehension: verbalized understanding and needs further education  HOME EXERCISE PROGRAM: Will give next visit.  ASSESSMENT:  CLINICAL IMPRESSION: Patient is a 69 y.o. female with a dx's of lumbar radiculopathy and thoracolumbar scoliosis.  Pt has a hx of chronic back pain and a hx of scoliosis.  Her back has been bothering her over the past 3-4 years.  Pt states when she finished chemo, she had more energy and increased her activity level.  Pt then noticed having increased pain.  Pt had x rays and is scheduled for a MRI.  Pt has increased pain with standing and walking.  Pt states she feels wobbly and has weakness in L LE.  She has increased pain and difficulty with household chores and IADL's and is limited with standing duration.  Pt has weakness in bilat hips.  She should benefit from skilled PT to address impairments and improve overall function.   OBJECTIVE IMPAIRMENTS: Abnormal gait, decreased activity tolerance, decreased endurance, decreased mobility, difficulty walking, decreased ROM, decreased strength, hypomobility, impaired flexibility, postural dysfunction, and pain.   ACTIVITY LIMITATIONS: standing and locomotion level  PARTICIPATION LIMITATIONS: meal prep, cleaning, laundry, shopping, and community activity  PERSONAL FACTORS: Time since onset of injury/illness/exacerbation and 1-2 comorbidities: L TKA, CLL are also affecting patient's functional outcome.   REHAB POTENTIAL: Good  CLINICAL DECISION MAKING: Stable/uncomplicated  EVALUATION COMPLEXITY: Low   GOALS:   SHORT TERM GOALS: Target date: 10/02/2024   Pt will be independent and compliant with HEP for improved pain, strength, and function.   Baseline: Goal  status: INITIAL  2.  Pt will be able to chang her sheets with <2/10 pain.  Baseline:  Goal status: INITIAL Target date:  10/09/24  3.  Pt will report at least a 25% improvement in pain and sx's overall.  Baseline:  Goal status: INITIAL  4.  Pt will demo improved quality of gait as evidenced by reduced antalgic gait.  Baseline:  Goal status: INITIAL    LONG TERM GOALS: Target date: 10/23/2024  Pt will be able to stand to cook a meal and prepare food without significant pain and difficulty.  Baseline:  Goal status: INITIAL  2.  Pt will report she is able to perform her household chores without significant pain.  Baseline:  Goal status: INITIAL  3.  Pt will demo improved core and postural strength as evidenced by performance of exercises without adverse effects and improved hip strength to 5/5 in hip flexion bilat and R hip ER to 4+/5 for improved performance of and tolerance with daily mobility and IADL's. Baseline:  Goal status: INITIAL  4.  Pt will report at least a 70% improvement in ambulation including pain and sx's with her daily ambulation. Baseline:  Goal status: INITIAL    PLAN:  PT FREQUENCY: 2x/week  PT DURATION: 6 weeks  PLANNED INTERVENTIONS: [id number]- PT Re-evaluation, 97750- Physical Performance Testing, 97110-Therapeutic exercises, 97530- Therapeutic activity, [id number]- Neuromuscular re-education, 97535- Self Care, [id number]- Manual therapy, [id number]- Gait training, [phone]- Aquatic Therapy, [phone]- Electrical stimulation (unattended), [phone] (1-2 muscles), [id number] (3+ muscles)- Dry Needling, Patient/Family education, Balance training, Stair training, Taping, Cryotherapy, and Moist heat.  PLAN FOR NEXT SESSION: Cont with postural and core strengthening and stabilization.  Cont with hip strengthening.    Leigh Minerva III PT, DPT  09/13/24 3:04 PM   "

## 2024-09-11 ENCOUNTER — Other Ambulatory Visit: Payer: Self-pay

## 2024-09-11 ENCOUNTER — Ambulatory Visit (HOSPITAL_BASED_OUTPATIENT_CLINIC_OR_DEPARTMENT_OTHER): Attending: Physical Medicine & Rehabilitation | Admitting: Physical Therapy

## 2024-09-11 DIAGNOSIS — R293 Abnormal posture: Secondary | ICD-10-CM | POA: Diagnosis present

## 2024-09-11 DIAGNOSIS — M6281 Muscle weakness (generalized): Secondary | ICD-10-CM | POA: Diagnosis present

## 2024-09-11 DIAGNOSIS — R262 Difficulty in walking, not elsewhere classified: Secondary | ICD-10-CM | POA: Diagnosis present

## 2024-09-11 DIAGNOSIS — M5459 Other low back pain: Secondary | ICD-10-CM | POA: Insufficient documentation

## 2024-09-12 ENCOUNTER — Encounter (HOSPITAL_BASED_OUTPATIENT_CLINIC_OR_DEPARTMENT_OTHER): Payer: Self-pay | Admitting: Physical Therapy

## 2024-09-18 ENCOUNTER — Encounter (HOSPITAL_BASED_OUTPATIENT_CLINIC_OR_DEPARTMENT_OTHER): Payer: Self-pay

## 2024-09-19 ENCOUNTER — Ambulatory Visit (HOSPITAL_BASED_OUTPATIENT_CLINIC_OR_DEPARTMENT_OTHER): Payer: Self-pay

## 2024-09-25 ENCOUNTER — Ambulatory Visit (HOSPITAL_BASED_OUTPATIENT_CLINIC_OR_DEPARTMENT_OTHER): Payer: Self-pay | Admitting: Physical Therapy

## 2024-09-26 ENCOUNTER — Ambulatory Visit (HOSPITAL_BASED_OUTPATIENT_CLINIC_OR_DEPARTMENT_OTHER): Admitting: Physical Therapy

## 2024-09-26 ENCOUNTER — Encounter (HOSPITAL_BASED_OUTPATIENT_CLINIC_OR_DEPARTMENT_OTHER): Payer: Self-pay | Admitting: Physical Therapy

## 2024-09-26 DIAGNOSIS — M6281 Muscle weakness (generalized): Secondary | ICD-10-CM

## 2024-09-26 DIAGNOSIS — M5459 Other low back pain: Secondary | ICD-10-CM

## 2024-09-26 DIAGNOSIS — R262 Difficulty in walking, not elsewhere classified: Secondary | ICD-10-CM

## 2024-09-26 DIAGNOSIS — R293 Abnormal posture: Secondary | ICD-10-CM

## 2024-09-28 ENCOUNTER — Encounter (HOSPITAL_BASED_OUTPATIENT_CLINIC_OR_DEPARTMENT_OTHER): Payer: Self-pay

## 2024-09-28 ENCOUNTER — Ambulatory Visit (HOSPITAL_BASED_OUTPATIENT_CLINIC_OR_DEPARTMENT_OTHER)

## 2024-09-28 DIAGNOSIS — M6281 Muscle weakness (generalized): Secondary | ICD-10-CM

## 2024-09-28 DIAGNOSIS — M5459 Other low back pain: Secondary | ICD-10-CM

## 2024-09-28 DIAGNOSIS — R262 Difficulty in walking, not elsewhere classified: Secondary | ICD-10-CM

## 2024-09-28 DIAGNOSIS — R293 Abnormal posture: Secondary | ICD-10-CM

## 2024-09-28 NOTE — Therapy (Signed)
 " OUTPATIENT PHYSICAL THERAPY THORACOLUMBAR TREATMENT    Patient Name: Autumn Hines MRN: [id number] DOB:[birth year], 69 y.o., female Today's Date: 09/28/2024  END OF SESSION:  PT End of Session - 09/28/24 1437     Visit Number 3    Number of Visits 12    Date for Recertification  10/23/24    PT Start Time 1433    PT Stop Time 1515    PT Time Calculation (min) 42 min    Activity Tolerance Patient tolerated treatment well    Behavior During Therapy [facility] for tasks assessed/performed            Past Medical History:  Diagnosis Date   Asthma    allergy induced   CLL (chronic lymphocytic leukemia) (HCC) 2019   started chemo 2022   Environmental and seasonal allergies    GERD (gastroesophageal reflux disease)    Hypertension    Hypothyroidism    PONV (postoperative nausea and vomiting)    Past Surgical History:  Procedure Laterality Date   ABDOMINAL HYSTERECTOMY     CHOLECYSTECTOMY     DORSAL COMPARTMENT RELEASE Left 03/04/2022   Procedure: LEFT FIRST DORSAL COMPARTMENT RELEASE (DEQUERVAIN);  Surgeon: Romona Harari, MD;  Location: [facility];  Service: Orthopedics;  Laterality: Left;   KNEE ARTHROSCOPY Left    x2   LYMPH NODE BIOPSY Left    removal of parathyroid adenoma     Patient Active Problem List   Diagnosis Date Noted   De Quervain's tenosynovitis, left 02/12/2022    PCP: Darell Barefoot, MD  REFERRING PROVIDER: Voncile Carne, DO  REFERRING DIAG:  [phone] (ICD-10-CM) - Radiculopathy, lumbar region M41.25 (ICD-10-CM) - Other idiopathic scoliosis, thoracolumbar region  Rationale for Evaluation and Treatment: Rehabilitation  THERAPY DIAG:  Other low back pain  Muscle weakness (generalized)  Difficulty in walking, not elsewhere classified  Abnormal posture  ONSET DATE: chronic back pain / PT order 06/27/24  SUBJECTIVE:                                                                                                                                                                                            SUBJECTIVE STATEMENT:  Pt saw MD for MRI review. Felt good after PT and wants to try that for 6-7weeks before a potential injection. I want to try a heel lift in my R shoe to see if it helps.   EVAL: Pt states she has had back pain since she was 75-28 years old.  Pt wore a [place] back brace for scoliosis when she was younger.  Pt states she hasn't really had much trouble with her  back until the past 3-4 years.  Pt saw MD on 06/27/24 and had x rays.  Pt is scheduled for MRI this Friday and returns to see him on 09/27/24.  MD note indicated thoracolumbar pain related to his significant scoliotic deformity in addition to symptoms consistent with probable lumbar stenosis with associated claudication type symptoms in her left lower extremity.  Discussed formal physical therapy for strengthening/stabilization.  Pt had fatigue with CLL and during the process of receiving chemo.  When pt finished chemo, she had more energy and increased her activity level.  Pt reports that's when she noticed having increased pain.  Pt has increased pain with standing and walking.  Pt c/o's of L hip pain when she walks for > 1 min.  Pt feels better when pushing a grocery cart or using hiking poles.  Pt performs a walking program 1.5-2 miles 2-3x/wk.   It's becoming more of struggle to walk and balance.  Pt states she feels wobbly and has weakness in L LE.  Pt reports 4.5/10 pain when changing her sheets.  Pt has increased pain with standing to cook a meal.  She has to sit down to rest her back.  Pt has increased pain with household chores and has to take rest breaks.         PERTINENT HISTORY:  Osteoporosis--Pt thinks osteopenia Chronic lymphocytic leukemia (CLL)  2019.  Finished chemo in 2023.  Pt states she's in total remission.  L TKR 01/25 HTN Surgery for Dequervain's  2023   Carpal Tunnel Release    PAIN:  NPRS:  3/10 current, to  7-8/10 worst in past week Worst pain is with standing unsupported Location:  L sided lumbar and L post hip/glute Type:  sharp ache.  tingling in L LE.  Pt denies shooting pain down LE Overdoing makes pain worse, advil and back and body helps pain   PRECAUTIONS: Other: L TKR, osteoporosis/osteopenia   WEIGHT BEARING RESTRICTIONS: No  FALLS:  Has patient fallen in last 6 months? No   OCCUPATION: Pt is retired  PLOF: Independent  PATIENT GOALS: improve balance, stamina, and L LE strength  NEXT MD VISIT: 09/27/2024  OBJECTIVE:  Note: Objective measures were completed at Evaluation unless otherwise noted.  DIAGNOSTIC FINDINGS:  Scoliosis films taken in office today demonstrating a significant lumbar levoscoliosis/thoracic dextroscoliosis. Multilevel spondylosis per MD note  PATIENT SURVEYS:  Modified Oswestry Disability Index:  22%  COGNITION: Overall cognitive status: Within functional limits for tasks assessed      MUSCLE LENGTH: Hamstrings: tightness in bilat  POSTURE: pt has a scoliotic curve.  Thoracic curve to R.  L hip higher.   PALPATION: Pt had no tenderness to palpate bilat lumbar paraspinals and glute  LUMBAR ROM:   AROM eval  Flexion WFL  Extension   Right lateral flexion WFL  Left lateral flexion 75%  Right rotation WFL  Left rotation 75%   (Blank rows = not tested)   LOWER EXTREMITY MMT:    MMT Right eval Left eval  Hip flexion 4/5 4/5  Hip extension    Hip abduction 26.0 29.1  Hip adduction    Hip internal rotation    Hip external rotation 4-/5 4+/5  Knee flexion 5/5 seated 5/5 seated  Knee extension 5/5 5/5  Ankle dorsiflexion 5/5 5/5  Ankle plantarflexion WFL seated WFL seated  Ankle inversion    Ankle eversion     (Blank rows = not tested)  LUMBAR SPECIAL TESTS:  Slump test:  negative  bialt SLR test:  negative bilat    GAIT: Assistive device utilized: None Level of assistance: Complete Independence Comments: antalgic gait,  increased stance time on L, harder foot strike on R.  Thoracic curve to Right  TREATMENT:                                                                                                                                 09/28/24 Nu step L5 xmin Heel lift in R shoe Gait in hall x117ft Hip hikes of 4 step x20 (difficult standing on L LE) S/l open book stretching 5 x10ea SKTC 5 x5ea PPT 3 x10 Low range bridge with RTB at thighs 2x5 S/l clam with RTB at thighs x10B HEP update- added open book and hip hikes  09/26/24  Nustep L4x8 minutes seat 9, all four extremities  Lumbar rotation stretch 10x5 seconds alternating  SKTC 6x5 seconds alternating Small range bridges with red TB above knees x10  PPT 10x3 seconds  Sidelying clams red TB x10 B Scap retractions red TB 2x12  Shoulder extensions red TB 2x12  3 way QL stretch with stool 30 seconds each     PATIENT EDUCATION:  Education details: dx, objective findings, rationale of interventions, POC, relevant anatomy, posture, and what to expect next treatment.  PT answered Pt's questions.  Person educated: Patient Education method: Medical Illustrator Education comprehension: verbalized understanding and needs further education  HOME EXERCISE PROGRAM:  Access Code: [email or web address] URL: [email or web address] Date: 09/26/2024 Prepared by: Josette Rough  ASSESSMENT:  CLINICAL IMPRESSION:  Trialed heel lift in R shoe with imrpvement noted in gait quality. Educated on proper use of heel lift and expectations. With hip hikes, pt challenged when WB through L LE. Quick to fatigue in glute med and had poor NMC/activation. Added to HEP for pt to work on at home. She responded well to gentle mobility and strengthening exercises with cues for [facility] and proper technique. Will continue to progress as tolerated.     EVAL: Patient is a 69 y.o. female with a dx's of lumbar radiculopathy and thoracolumbar scoliosis.  Pt has a hx of  chronic back pain and a hx of scoliosis.  Her back has been bothering her over the past 3-4 years.  Pt states when she finished chemo, she had more energy and increased her activity level.  Pt then noticed having increased pain.  Pt had x rays and is scheduled for a MRI.  Pt has increased pain with standing and walking.  Pt states she feels wobbly and has weakness in L LE.  She has increased pain and difficulty with household chores and IADL's and is limited with standing duration.  Pt has weakness in bilat hips.  She should benefit from skilled PT to address impairments and improve overall function.   OBJECTIVE IMPAIRMENTS: Abnormal gait, decreased activity tolerance, decreased endurance, decreased mobility, difficulty walking, decreased ROM, decreased strength, hypomobility, impaired flexibility, postural dysfunction, and  pain.   ACTIVITY LIMITATIONS: standing and locomotion level  PARTICIPATION LIMITATIONS: meal prep, cleaning, laundry, shopping, and community activity  PERSONAL FACTORS: Time since onset of injury/illness/exacerbation and 1-2 comorbidities: L TKA, CLL are also affecting patient's functional outcome.   REHAB POTENTIAL: Good  CLINICAL DECISION MAKING: Stable/uncomplicated  EVALUATION COMPLEXITY: Low   GOALS:   SHORT TERM GOALS: Target date: 10/02/2024   Pt will be independent and compliant with HEP for improved pain, strength, and function.   Baseline: Goal status: INITIAL  2.  Pt will be able to chang her sheets with <2/10 pain.  Baseline:  Goal status: INITIAL Target date:  10/09/24  3.  Pt will report at least a 25% improvement in pain and sx's overall.  Baseline:  Goal status: INITIAL  4.  Pt will demo improved quality of gait as evidenced by reduced antalgic gait.  Baseline:  Goal status: INITIAL    LONG TERM GOALS: Target date: 10/23/2024  Pt will be able to stand to cook a meal and prepare food without significant pain and difficulty.  Baseline:  Goal  status: INITIAL  2.  Pt will report she is able to perform her household chores without significant pain.  Baseline:  Goal status: INITIAL  3.  Pt will demo improved core and postural strength as evidenced by performance of exercises without adverse effects and improved hip strength to 5/5 in hip flexion bilat and R hip ER to 4+/5 for improved performance of and tolerance with daily mobility and IADL's. Baseline:  Goal status: INITIAL  4.  Pt will report at least a 70% improvement in ambulation including pain and sx's with her daily ambulation. Baseline:  Goal status: INITIAL    PLAN:  PT FREQUENCY: 2x/week  PT DURATION: 6 weeks  PLANNED INTERVENTIONS: [id number]- PT Re-evaluation, 97750- Physical Performance Testing, 97110-Therapeutic exercises, 97530- Therapeutic activity, [id number]- Neuromuscular re-education, 97535- Self Care, [id number]- Manual therapy, [id number]- Gait training, [phone]- Aquatic Therapy, [phone]- Electrical stimulation (unattended), [phone] (1-2 muscles), [id number] (3+ muscles)- Dry Needling, Patient/Family education, Balance training, Stair training, Taping, Cryotherapy, and Moist heat.  PLAN FOR NEXT SESSION: Cont with postural and core strengthening and stabilization.  Cont with hip strengthening. How does HEP feel?    Asberry Rodes, PTA  09/28/24 4:30 PM    "

## 2024-10-03 ENCOUNTER — Ambulatory Visit (HOSPITAL_BASED_OUTPATIENT_CLINIC_OR_DEPARTMENT_OTHER): Payer: Self-pay | Admitting: Physical Therapy

## 2024-10-10 ENCOUNTER — Encounter (HOSPITAL_BASED_OUTPATIENT_CLINIC_OR_DEPARTMENT_OTHER): Payer: Self-pay | Admitting: Physical Therapy

## 2024-10-17 ENCOUNTER — Encounter (HOSPITAL_BASED_OUTPATIENT_CLINIC_OR_DEPARTMENT_OTHER): Payer: Self-pay | Admitting: Physical Therapy

## 2024-10-24 ENCOUNTER — Encounter (HOSPITAL_BASED_OUTPATIENT_CLINIC_OR_DEPARTMENT_OTHER): Admitting: Physical Therapy

## 2024-10-26 ENCOUNTER — Encounter (HOSPITAL_BASED_OUTPATIENT_CLINIC_OR_DEPARTMENT_OTHER): Admitting: Physical Therapy
